# Patient Record
Sex: Female | Born: 2005
Health system: Southern US, Community
[De-identification: ages and names within clinical notes are randomized; demographics above are authoritative.]

## PROBLEM LIST (undated history)

## (undated) HISTORY — PX: TYMPANOSTOMY TUBE PLACEMENT: SHX32

---
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
## 2023-01-08 DIAGNOSIS — Z419 Encounter for procedure for purposes other than remedying health state, unspecified: Secondary | ICD-10-CM | POA: Diagnosis not present

## 2023-02-08 DIAGNOSIS — Z419 Encounter for procedure for purposes other than remedying health state, unspecified: Secondary | ICD-10-CM | POA: Diagnosis not present

## 2023-02-27 ENCOUNTER — Ambulatory Visit (INDEPENDENT_AMBULATORY_CARE_PROVIDER_SITE_OTHER): Payer: BC Managed Care – PPO | Admitting: Family Medicine

## 2023-02-27 ENCOUNTER — Encounter: Payer: Self-pay | Admitting: Family Medicine

## 2023-02-27 VITALS — BP 102/65 | HR 90 | Temp 98.4°F | Wt 258.0 lb

## 2023-02-27 DIAGNOSIS — H6991 Unspecified Eustachian tube disorder, right ear: Secondary | ICD-10-CM

## 2023-02-27 DIAGNOSIS — H6501 Acute serous otitis media, right ear: Secondary | ICD-10-CM | POA: Diagnosis not present

## 2023-02-27 MED ORDER — AMOXICILLIN-POT CLAVULANATE 875-125 MG PO TABS: 1.0000 | ORAL_TABLET | Freq: Two times a day (BID) | ORAL | 0 refills | Status: DC

## 2023-02-27 NOTE — Progress Notes (Signed)
Subjective: CC: Ear pain PCP: Gabriel Earing, FNP Abigail Barrett is a 17 y.o. female presenting to clinic today for:  1.  Ear pain Patient reports right-sided ear pain that has been ongoing for about 3 days now.  She reports some clear fluid that came out of it but denies any purulence, bleeding.  She does feel little dizzy at times but denies any falls.  Hearing has been a little duller as well.  She reports no nausea, vomiting, rhinorrhea, congestion, cough.  She has utilize heat, cold but those seem to make things worse.  She took some Tylenol after she had a fever to 101.8 F.   ROS: Per HPI  Allergies  Allergen Reactions   Red Dye #40 (Allura Red) Hives   History reviewed. No pertinent past medical history.  Current Outpatient Medications:    amoxicillin-clavulanate (AUGMENTIN) 875-125 MG tablet, Take 1 tablet by mouth 2 (two) times daily., Disp: 20 tablet, Rfl: 0   busPIRone (BUSPAR) 5 MG tablet, Take 1 tablet (5 mg total) by mouth 2 (two) times daily., Disp: 60 tablet, Rfl: 3   medroxyPROGESTERone Acetate 150 MG/ML SUSY, INJECT INTO THE MUSCLE EVERY 3 MONTHS, Disp: 1 mL, Rfl: 1 Social History   Socioeconomic History   Marital status: Single    Spouse name: Not on file   Number of children: Not on file   Years of education: Not on file   Highest education level: Not on file  Occupational History   Not on file  Tobacco Use   Smoking status: Never    Passive exposure: Yes   Smokeless tobacco: Never  Vaping Use   Vaping status: Never Used  Substance and Sexual Activity   Alcohol use: No   Drug use: No   Sexual activity: Never  Other Topics Concern   Not on file  Social History Narrative   Abigail Barrett is in the 11th grade at [employer]; she does well in school. She live with parents in separate households.    Social Determinants of Health   Financial Resource Strain: Not on file  Food Insecurity: Not on file  Transportation Needs: Not on file  Physical  Activity: Not on file  Stress: Not on file  Social Connections: Unknown (11/21/2021)   Received from [facility]   Social Network    Social Network: Not on file  Intimate Partner Violence: Unknown (10/11/2021)   Received from [facility], [facility]   HITS    Physically Hurt: Not on file    Insult or Talk Down To: Not on file    Threaten Physical Harm: Not on file    Scream or Curse: Not on file   Family History  Problem Relation Age of Onset   Depression Mother    Migraines Mother    Diabetes Father    Hyperlipidemia Father    Hypertension Father    Anxiety disorder Father    Seizures Neg Hx    Bipolar disorder Neg Hx    Schizophrenia Neg Hx    ADD / ADHD Neg Hx    Autism Neg Hx     Objective: Office vital signs reviewed. BP 102/65   Pulse 90   Temp 98.4 F (36.9 C)   Wt 258 lb (117 kg)   SpO2 97%   BMI 45.70 kg/m   Physical Examination:  General: Awake, alert, well nourished, No acute distress HEENT: TMs intact bilaterally.  Right TM bulging with erythema at the base.  Mild milky appearance to the fluid behind the TM but not overt purulence yet.  No lymphadenopathy appreciated. Cardio: regular rate and rhythm  Pulm: normal work of breathing on room air   Assessment/ Plan: 17 y.o. female   Non-recurrent acute serous otitis media of right ear - Plan: amoxicillin-clavulanate (AUGMENTIN) 875-125 MG tablet  Eustachian tube dysfunction, right  Augmentin sent.  Home care instructions were reinforced.  Reasons for reevaluation discussed.  Follow-up as needed   Raliegh Ip, DO [facility] [phone]

## 2023-02-27 NOTE — Patient Instructions (Signed)
Eustachian Tube Dysfunction  Eustachian tube dysfunction refers to a condition in which a blockage develops in the narrow passage that connects the middle ear to the back of the nose (eustachian tube). The eustachian tube regulates air pressure in the middle ear by letting air move between the ear and nose. It also helps to drain fluid from the middle ear space. Eustachian tube dysfunction can affect one or both ears. When the eustachian tube does not function properly, air pressure, fluid, or both can build up in the middle ear. What are the causes? This condition occurs when the eustachian tube becomes blocked or cannot open normally. Common causes of this condition include: Ear infections. Colds and other infections that affect the nose, mouth, and throat (upper respiratory tract). Allergies. Irritation from cigarette smoke. Irritation from stomach acid coming up into the esophagus (gastroesophageal reflux). The esophagus is the part of the body that moves food from the mouth to the stomach. Sudden changes in air pressure, such as from descending in an airplane or scuba diving. Abnormal growths in the nose or throat, such as: Growths that line the nose (nasal polyps). Abnormal growth of cells (tumors). Enlarged tissue at the back of the throat (adenoids). What increases the risk? You are more likely to develop this condition if: You smoke. You are overweight. You are a child who has: Certain birth defects of the mouth, such as cleft palate. Large tonsils or adenoids. What are the signs or symptoms? Common symptoms of this condition include: A feeling of fullness in the ear. Ear pain. Clicking or popping noises in the ear. Ringing in the ear (tinnitus). Hearing loss. Loss of balance. Dizziness. Symptoms may get worse when the air pressure around you changes, such as when you travel to an area of high elevation, fly on an airplane, or go scuba diving. How is this diagnosed? This  condition may be diagnosed based on: Your symptoms. A physical exam of your ears, nose, and throat. Tests, such as those that measure: The movement of your eardrum. Your hearing (audiometry). How is this treated? Treatment depends on the cause and severity of your condition. In mild cases, you may relieve your symptoms by moving air into your ears. This is called "popping the ears." In more severe cases, or if you have symptoms of fluid in your ears, treatment may include: Medicines to relieve congestion (decongestants). Medicines that treat allergies (antihistamines). Nasal sprays or ear drops that contain medicines that reduce swelling (steroids). A procedure to drain the fluid in your eardrum. In this procedure, a small tube may be placed in the eardrum to: Drain the fluid. Restore the air in the middle ear space. A procedure to insert a balloon device through the nose to inflate the opening of the eustachian tube (balloon dilation). Follow these instructions at home: Lifestyle Do not do any of the following until your health care provider approves: Travel to high altitudes. Fly in airplanes. Work in a pressurized cabin or room. Scuba dive. Do not use any products that contain nicotine or tobacco. These products include cigarettes, chewing tobacco, and vaping devices, such as e-cigarettes. If you need help quitting, ask your health care provider. Keep your ears dry. Wear fitted earplugs during showering and bathing. Dry your ears completely after. General instructions Take over-the-counter and prescription medicines only as told by your health care provider. Use techniques to help pop your ears as recommended by your health care provider. These may include: Chewing gum. Yawning. Frequent, forceful swallowing.   Closing your mouth, holding your nose closed, and gently blowing as if you are trying to blow air out of your nose. Keep all follow-up visits. This is important. Contact a  health care provider if: Your symptoms do not go away after treatment. Your symptoms come back after treatment. You are unable to pop your ears. You have: A fever. Pain in your ear. Pain in your head or neck. Fluid draining from your ear. Your hearing suddenly changes. You become very dizzy. You lose your balance. Get help right away if: You have a sudden, severe increase in any of your symptoms. Summary Eustachian tube dysfunction refers to a condition in which a blockage develops in the eustachian tube. It can be caused by ear infections, allergies, inhaled irritants, or abnormal growths in the nose or throat. Symptoms may include ear pain or fullness, hearing loss, or ringing in the ears. Mild cases are treated with techniques to unblock the ears, such as yawning or chewing gum. More severe cases are treated with medicines or procedures. This information is not intended to replace advice given to you by your health care provider. Make sure you discuss any questions you have with your health care provider. Document Revised: 09/06/2020 Document Reviewed: 09/06/2020 Elsevier Patient Education  2024 Elsevier Inc.  

## 2023-03-11 DIAGNOSIS — Z419 Encounter for procedure for purposes other than remedying health state, unspecified: Secondary | ICD-10-CM | POA: Diagnosis not present

## 2023-03-15 ENCOUNTER — Ambulatory Visit (INDEPENDENT_AMBULATORY_CARE_PROVIDER_SITE_OTHER): Payer: BC Managed Care – PPO

## 2023-03-15 DIAGNOSIS — Z3042 Encounter for surveillance of injectable contraceptive: Secondary | ICD-10-CM

## 2023-03-15 MED ORDER — MEDROXYPROGESTERONE ACETATE 150 MG/ML IM SUSP
150.0000 mg | INTRAMUSCULAR | Status: DC
  Administered 2023-03-15 – 2023-05-31 (×2): 150 mg via INTRAMUSCULAR

## 2023-03-15 NOTE — Progress Notes (Signed)
Medroxyprogesterone injection given to right upper outer quadrant.  Patient tolerated well. 

## 2023-04-10 DIAGNOSIS — Z419 Encounter for procedure for purposes other than remedying health state, unspecified: Secondary | ICD-10-CM | POA: Diagnosis not present

## 2023-05-03 ENCOUNTER — Ambulatory Visit: Payer: Medicaid Other | Admitting: Family Medicine

## 2023-05-09 IMAGING — US US PELVIS COMPLETE TRANSABD/TRANSVAG W DUPLEX
1 series · 13 of 25 positions shown · non-contrast
Comparison: None.

CLINICAL DATA: Right lower quadrant pain for 4 days, rule out
torsion

EXAM:
TRANSABDOMINAL AND TRANSVAGINAL ULTRASOUND OF PELVIS
DOPPLER ULTRASOUND OF OVARIES
TECHNIQUE: Both transabdominal and transvaginal ultrasound examinations of the
pelvis were performed. Transabdominal technique was performed for
global imaging of the pelvis including uterus, ovaries, adnexal
regions, and pelvic cul-de-sac.
It was necessary to proceed with endovaginal exam following the
transabdominal exam to visualize the uterus, endometrium, ovaries,
and adnexa. Color and duplex Doppler ultrasound was utilized to
evaluate blood flow to the ovaries.

[Series 1: us pelvic complete w transvaginal and torsion righ · 13 of 103 slices shown]
[im 1/103]
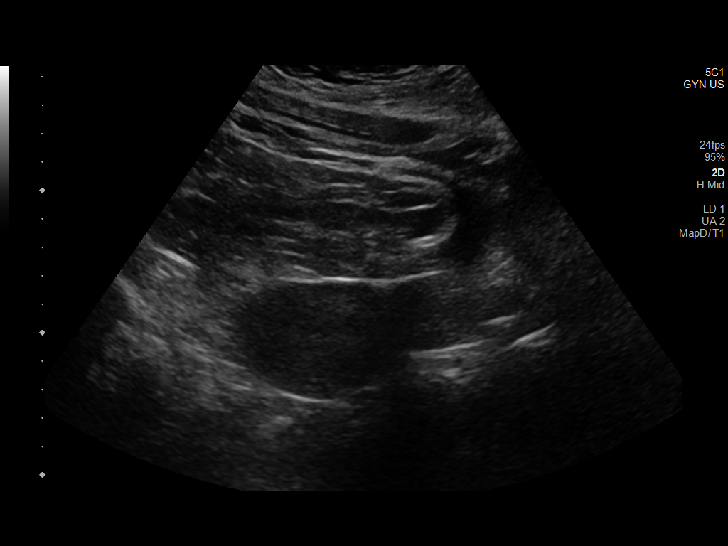
[im 9/103]
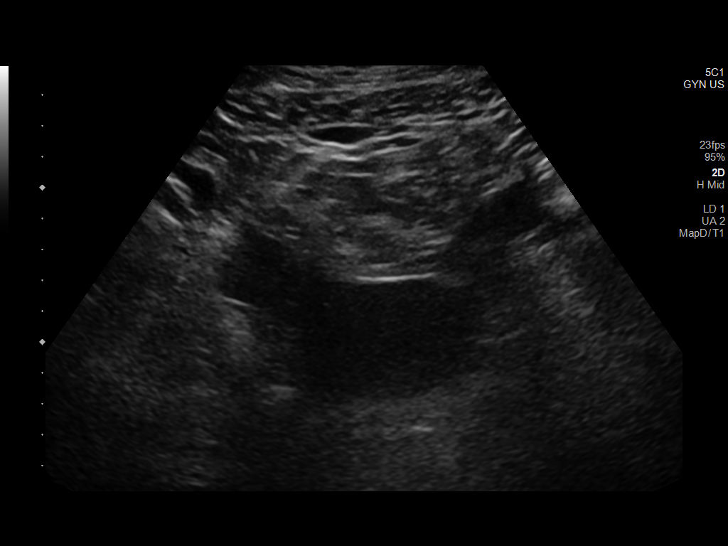
[im 18/103]
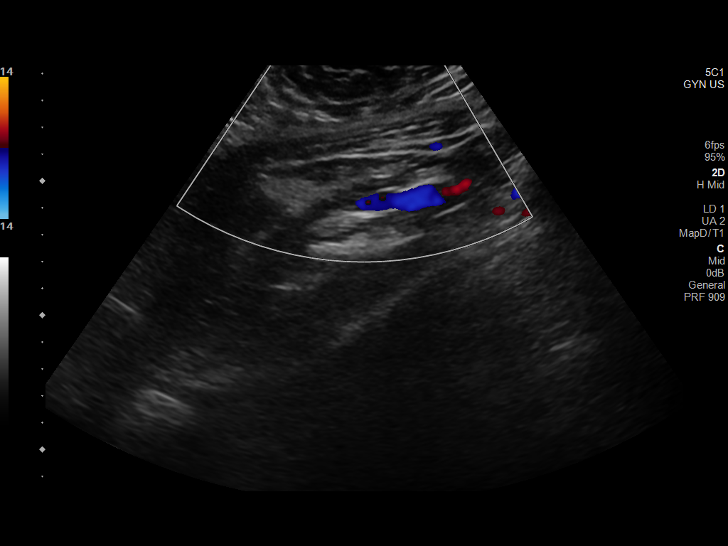
[im 26/103]
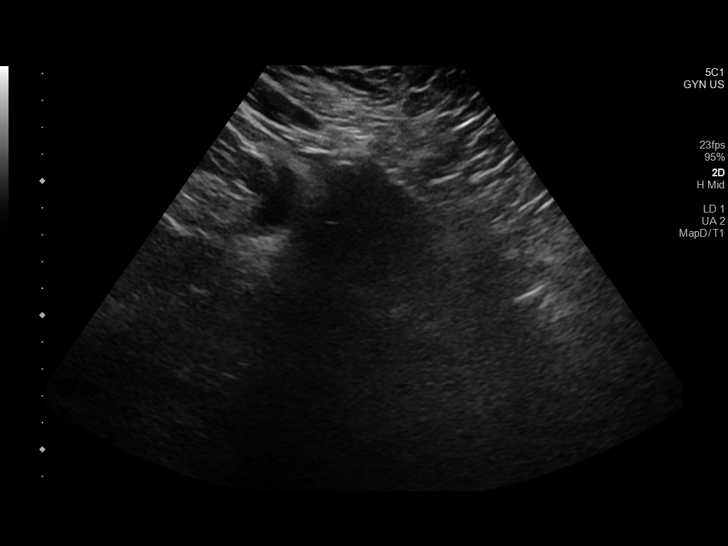
[im 35/103]
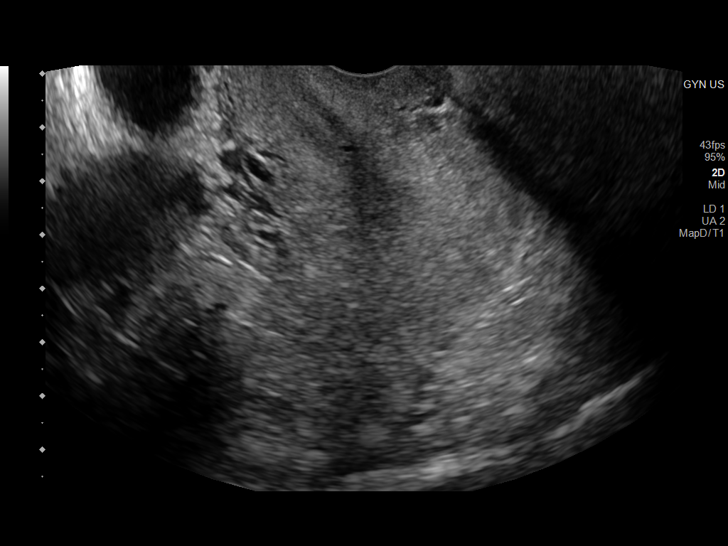
[im 43/103]
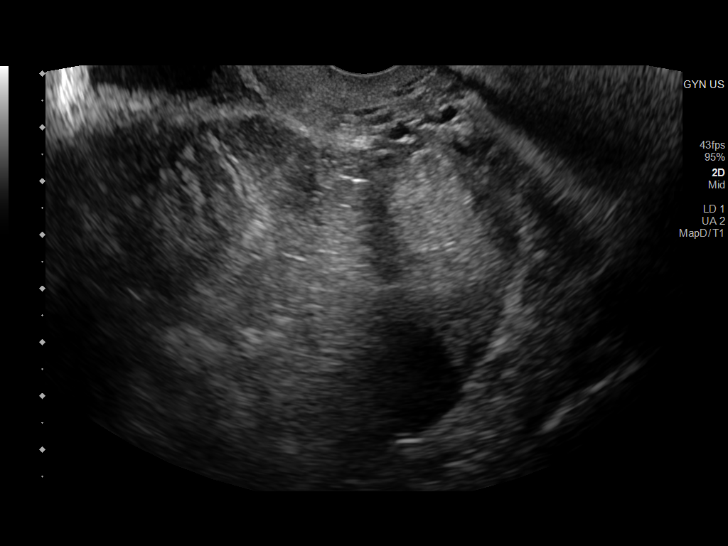
[im 52/103]
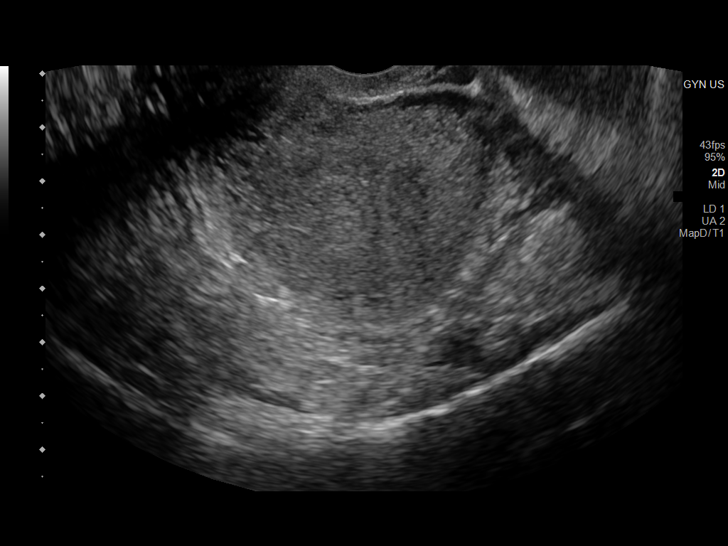
[im 60/103]
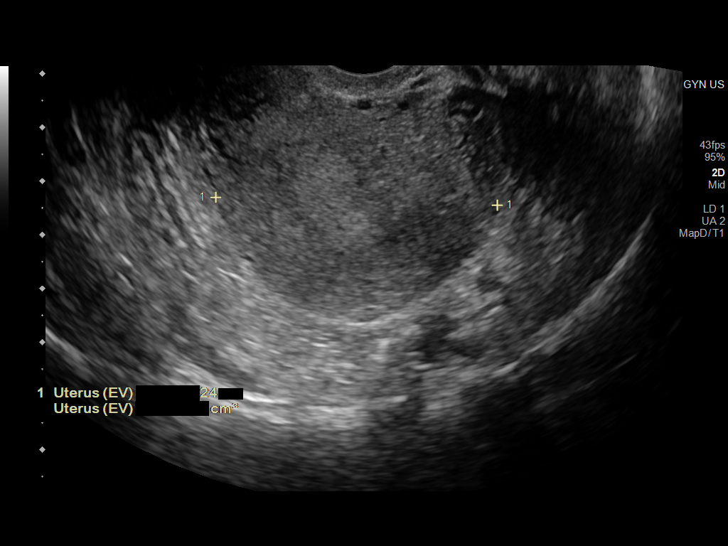
[im 69/103]
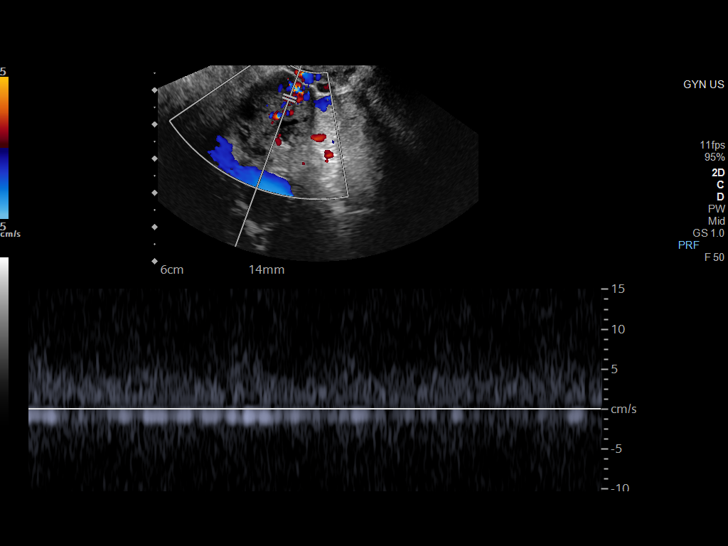
[im 77/103]
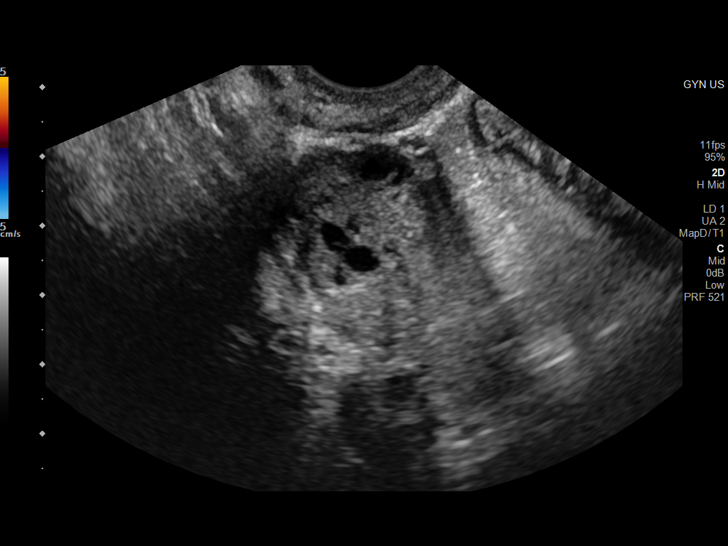
[im 86/103]
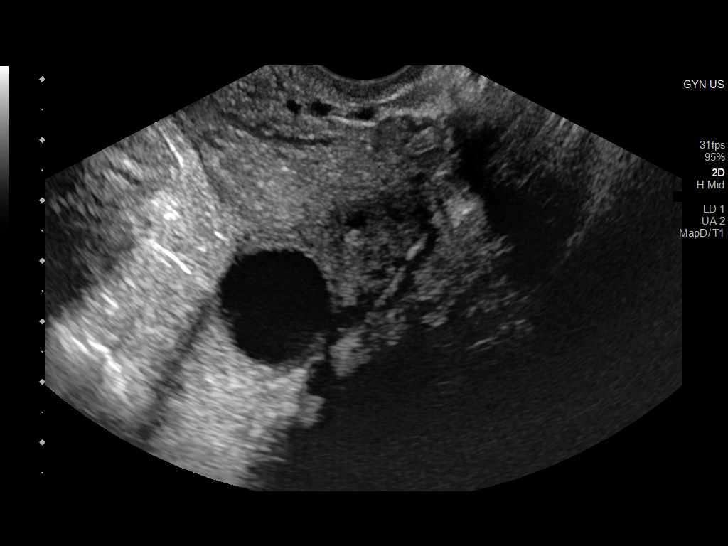
[im 94/103]
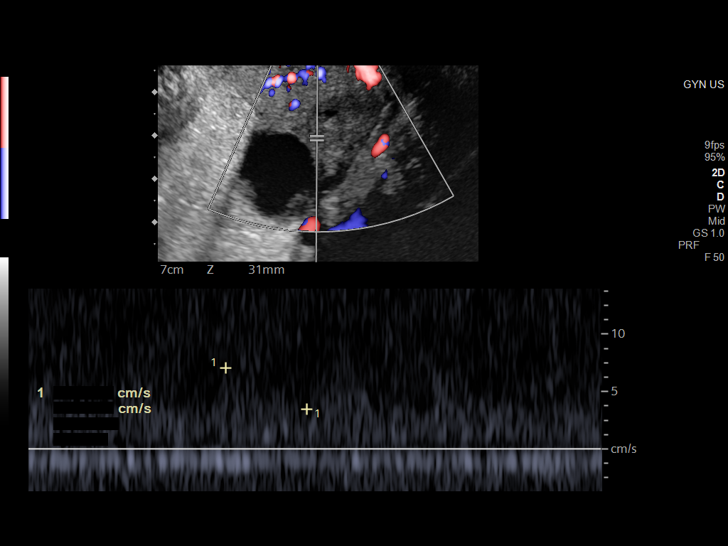
[im 103/103]
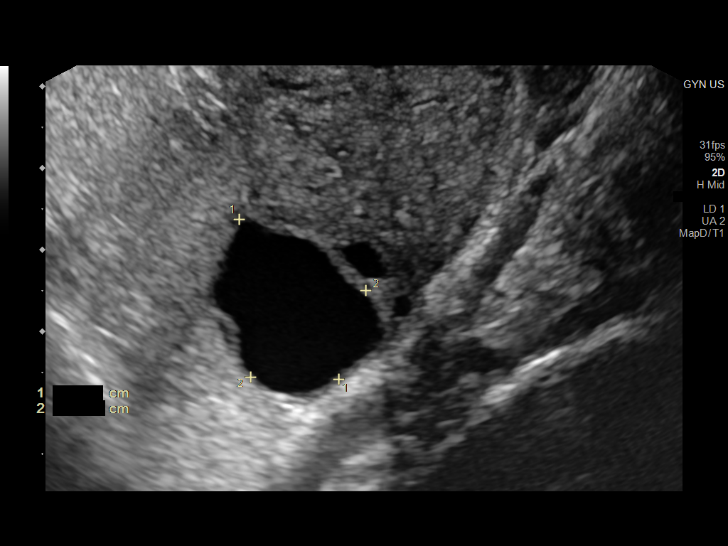

[13 of 25 positions shown; findings below may reference images not displayed]

FINDINGS: Uterus

Measurements: 7.7 x 4.4 x 5.2 cm = volume: 93 mL. No fibroids or
other mass visualized.

Endometrium

Thickness: 6 mm.  No focal abnormality visualized.

Right ovary

Measurements: 3.1 x 2.3 x 2.6 cm = volume: 10 mL. Normal
appearance/no adnexal mass.

Left ovary

Measurements: 4.2 x 2.4 x 2.5 cm = volume: 13 mL. Normal
appearance/no adnexal mass. Simple cyst measuring 1.9 cm.

Pulsed Doppler evaluation of both ovaries demonstrates normal
low-resistance arterial and venous waveforms.

Other findings

No abnormal free fluid.
IMPRESSION: 1.  No ultrasound findings of the pelvis to explain pain.

2. Normal size and appearance of the ovaries. Symmetric arterial and
venous Doppler flow is present bilaterally.

## 2023-05-11 DIAGNOSIS — Z419 Encounter for procedure for purposes other than remedying health state, unspecified: Secondary | ICD-10-CM | POA: Diagnosis not present

## 2023-05-24 ENCOUNTER — Telehealth: Payer: Self-pay | Admitting: Family Medicine

## 2023-05-24 ENCOUNTER — Other Ambulatory Visit (HOSPITAL_COMMUNITY): Payer: Self-pay

## 2023-05-24 NOTE — Telephone Encounter (Signed)
Pharmacy Patient Advocate Encounter     Per test claim: The current 90 day co-pay is, $0.  No PA needed at this time. This test claim was processed through [facility]- copay amounts may vary at other pharmacies due to pharmacy/plan contracts, or as the patient moves through the different stages of their insurance plan.

## 2023-05-24 NOTE — Telephone Encounter (Signed)
Copied from CRM [id number]. Topic: Clinical - Medical Advice >> May 24, 2023 10:42 AM Raven B wrote: Reason for CRM: PT contacted pharmacy for refill of  medroxyPROGESTERone (DEPO-PROVERA) injection 150 mg was told needs prior authorization. PT call back # [phone]

## 2023-05-24 NOTE — Telephone Encounter (Signed)
Copied from CRM [id number]. Topic: Clinical - Medication Refill >> May 24, 2023 10:38 AM Raven B wrote: Most Recent Primary Care Visit:  Provider: Quay Burow  Department: [facility]  Visit Type: INJECTION  Date: 03/15/2023  Medication: ***  Has the patient contacted their pharmacy?  (Agent: If no, request that the patient contact the pharmacy for the refill. If patient does not wish to contact the pharmacy document the reason why and proceed with request.) (Agent: If yes, when and what did the pharmacy advise?)  Is this the correct pharmacy for this prescription?  If no, delete pharmacy and type the correct one.  This is the patient's preferred pharmacy:  [facility] [address] [place] - [address] [place] [address] [place] [address] Phone: [phone] Fax: [phone]   Has the prescription been filled recently?   Is the patient out of the medication?   Has the patient been seen for an appointment in the last year OR does the patient have an upcoming appointment?   Can we respond through MyChart?   Agent: Please be advised that Rx refills may take up to 3 business days. We ask that you follow-up with your pharmacy.

## 2023-05-24 NOTE — Telephone Encounter (Signed)
Pt needed appt with triage nurse for depo so scheduled her with Triage 05/31/23.

## 2023-05-29 ENCOUNTER — Other Ambulatory Visit: Payer: Self-pay | Admitting: Family Medicine

## 2023-05-29 ENCOUNTER — Telehealth: Payer: Self-pay

## 2023-05-29 DIAGNOSIS — Z3042 Encounter for surveillance of injectable contraceptive: Secondary | ICD-10-CM

## 2023-05-29 NOTE — Telephone Encounter (Signed)
Copied from CRM [id number]. Topic: Clinical - Medication Refill >> May 29, 2023  3:20 PM Larwance Sachs wrote: Reason for CRM: Patient stated recently checking on prescription for medroxyPROGESTERone (DEPO-PROVERA) injection 150 mg and was told it was being processed. Patient outreach to Pharmacy and was told prescription was expired. Patient is wondering can it be called In or can she be called for update before upcoming appointment   Call back at [phone]

## 2023-05-30 MED ORDER — MEDROXYPROGESTERONE ACETATE 150 MG/ML IM SUSY: PREFILLED_SYRINGE | INTRAMUSCULAR | 0 refills | Status: DC

## 2023-05-30 NOTE — Addendum Note (Signed)
Addended by: Hessie Diener on: 05/30/2023 07:45 AM   Modules accepted: Orders

## 2023-05-30 NOTE — Telephone Encounter (Signed)
I spoke to pt and advised 1 refill would be sent in and that she would need appt for CPE prior to any further refills.

## 2023-05-31 ENCOUNTER — Ambulatory Visit (INDEPENDENT_AMBULATORY_CARE_PROVIDER_SITE_OTHER): Payer: Medicaid Other

## 2023-05-31 DIAGNOSIS — Z3042 Encounter for surveillance of injectable contraceptive: Secondary | ICD-10-CM

## 2023-05-31 NOTE — Progress Notes (Signed)
Medroxyprogesterone injection given to left upper outer quadrant.  Patient tolerated well. 

## 2023-06-01 ENCOUNTER — Ambulatory Visit: Payer: Medicaid Other | Admitting: Family Medicine

## 2023-06-10 DIAGNOSIS — Z419 Encounter for procedure for purposes other than remedying health state, unspecified: Secondary | ICD-10-CM | POA: Diagnosis not present

## 2023-07-11 DIAGNOSIS — Z419 Encounter for procedure for purposes other than remedying health state, unspecified: Secondary | ICD-10-CM | POA: Diagnosis not present

## 2023-08-11 DIAGNOSIS — Z419 Encounter for procedure for purposes other than remedying health state, unspecified: Secondary | ICD-10-CM | POA: Diagnosis not present

## 2023-08-22 ENCOUNTER — Other Ambulatory Visit: Payer: Self-pay | Admitting: Family Medicine

## 2023-08-22 DIAGNOSIS — Z3042 Encounter for surveillance of injectable contraceptive: Secondary | ICD-10-CM

## 2023-08-23 ENCOUNTER — Encounter: Payer: Medicaid Other | Admitting: Family Medicine

## 2023-08-23 MED ORDER — MEDROXYPROGESTERONE ACETATE 150 MG/ML IM SUSY: PREFILLED_SYRINGE | INTRAMUSCULAR | 0 refills | Status: DC

## 2023-08-24 ENCOUNTER — Ambulatory Visit: Payer: Medicaid Other | Admitting: Family Medicine

## 2023-08-24 ENCOUNTER — Encounter: Payer: Self-pay | Admitting: Family Medicine

## 2023-08-24 VITALS — BP 109/74 | HR 107 | Temp 98.5°F | Ht 63.0 in | Wt 269.0 lb

## 2023-08-24 DIAGNOSIS — F321 Major depressive disorder, single episode, moderate: Secondary | ICD-10-CM | POA: Diagnosis not present

## 2023-08-24 DIAGNOSIS — Z0000 Encounter for general adult medical examination without abnormal findings: Secondary | ICD-10-CM | POA: Diagnosis not present

## 2023-08-24 DIAGNOSIS — Z6841 Body Mass Index (BMI) 40.0 and over, adult: Secondary | ICD-10-CM | POA: Diagnosis not present

## 2023-08-24 DIAGNOSIS — Z3042 Encounter for surveillance of injectable contraceptive: Secondary | ICD-10-CM

## 2023-08-24 MED ORDER — MEDROXYPROGESTERONE ACETATE 150 MG/ML IM SUSY: PREFILLED_SYRINGE | INTRAMUSCULAR | 3 refills | Status: AC

## 2023-08-24 MED ORDER — MEDROXYPROGESTERONE ACETATE 150 MG/ML IM SUSY
150.0000 mg | PREFILLED_SYRINGE | Freq: Once | INTRAMUSCULAR | Status: AC
  Administered 2023-08-24: 150 mg via INTRAMUSCULAR

## 2023-08-24 MED ORDER — TESTOSTERONE CYPIONATE 200 MG/ML IM SOLN: 150.0000 mg | Freq: Once | INTRAMUSCULAR | Status: DC

## 2023-08-24 NOTE — Progress Notes (Signed)
Complete physical exam  Patient: Abigail Barrett   DOB: 23-May-2006   17 y.o. Female  MRN: [id number]  Subjective:    Chief Complaint  Patient presents with   Annual Exam    Depo provera injection     Abigail Barrett is a 19 y.o. female who presents today for a complete physical exam. She reports consuming a general diet. Gym/ health club routine includes running 2-3 miles daily. She generally feels well. She reports sleeping poorly overall due to work schedule. She does not have additional problems to discuss today.    Most recent fall risk assessment:    02/27/2023    9:03 AM  Fall Risk   Falls in the past year? 0  Number falls in past yr: 0  Injury with Fall? 0  Risk for fall due to : No Fall Risks  Follow up Education provided     Most recent depression screenings:    08/24/2023    1:10 PM 02/27/2023    9:02 AM 07/21/2022   10:08 AM  Depression screen PHQ 2/9  Decreased Interest 1 0 0  Down, Depressed, Hopeless 0 0 0  PHQ - 2 Score 1 0 0  Altered sleeping 2 2 0  Tired, decreased energy 3 2 0  Change in appetite 0 0 0  Feeling bad or failure about yourself  0 0 0  Trouble concentrating 0 2 0  Moving slowly or fidgety/restless 0 0 0  Suicidal thoughts 0 0 0  PHQ-9 Score 6 6 0  Difficult doing work/chores Somewhat difficult Somewhat difficult Not difficult at all      08/24/2023    1:10 PM 02/27/2023    9:02 AM 07/21/2022   10:08 AM 03/03/2022   10:34 AM  GAD 7 : Generalized Anxiety Score  Nervous, Anxious, on Edge 0 2 2 0  Control/stop worrying 0 2 2 2   Worry too much - different things 0 0 2 2  Trouble relaxing 3 3 3 2   Restless 2 0 3 2  Easily annoyed or irritable 3 3 1 1   Afraid - awful might happen 0 0 0 0  Total GAD 7 Score 8 10 13 9   Anxiety Difficulty Somewhat difficult Somewhat difficult Somewhat difficult Somewhat difficult      Vision:Not within last year  and Dental: No current dental problems and Receives regular dental care  History reviewed.  No pertinent past medical history.    Patient Care Team: Gabriel Earing, FNP as PCP - General (Family Medicine)   Outpatient Medications Prior to Visit  Medication Sig   medroxyPROGESTERone Acetate 150 MG/ML SUSY INJECT INTO THE MUSCLE EVERY 3 MONTHS   [DISCONTINUED] amoxicillin-clavulanate (AUGMENTIN) 875-125 MG tablet Take 1 tablet by mouth 2 (two) times daily.   [DISCONTINUED] busPIRone (BUSPAR) 5 MG tablet Take 1 tablet (5 mg total) by mouth 2 (two) times daily.   No facility-administered medications prior to visit.    ROS Negative unless specially indicated above in HPI.       Objective:     BP 109/74   Pulse (!) 107   Temp 98.5 F (36.9 C)   Ht 5\' 3"  (1.6 m)   Wt 269 lb (122 kg)   SpO2 96%   BMI 47.65 kg/m    Physical Exam Vitals and nursing note reviewed.  Constitutional:      General: She is not in acute distress.    Appearance: She is obese. She is not ill-appearing, toxic-appearing or  diaphoretic.  HENT:     Head: Normocephalic.     Right Ear: Tympanic membrane, ear canal and external ear normal.     Left Ear: Tympanic membrane, ear canal and external ear normal.     Nose: Nose normal.     Mouth/Throat:     Mouth: Mucous membranes are moist.     Pharynx: Oropharynx is clear.  Eyes:     Extraocular Movements: Extraocular movements intact.     Conjunctiva/sclera: Conjunctivae normal.     Pupils: Pupils are equal, round, and reactive to light.  Neck:     Thyroid: No thyroid mass, thyromegaly or thyroid tenderness.  Cardiovascular:     Rate and Rhythm: Normal rate and regular rhythm.     Pulses: Normal pulses.     Heart sounds: Normal heart sounds. No murmur heard.    No friction rub. No gallop.  Pulmonary:     Effort: Pulmonary effort is normal.     Breath sounds: Normal breath sounds.  Abdominal:     General: Bowel sounds are normal. There is no distension.     Palpations: Abdomen is soft. There is no mass.     Tenderness: There is no  abdominal tenderness. There is no guarding.  Musculoskeletal:        General: No swelling or tenderness. Normal range of motion.     Cervical back: Normal range of motion and neck supple.  Skin:    General: Skin is warm and dry.     Capillary Refill: Capillary refill takes less than 2 seconds.     Findings: No lesion or rash.  Neurological:     General: No focal deficit present.     Mental Status: She is alert and oriented to person, place, and time.     Cranial Nerves: No cranial nerve deficit.     Motor: No weakness.     Coordination: Coordination normal.     Gait: Gait normal.  Psychiatric:        Mood and Affect: Mood normal.        Behavior: Behavior normal.        Thought Content: Thought content normal.        Judgment: Judgment normal.      No results found for any visits on 08/24/23.     Assessment & Plan:    Routine Health Maintenance and Physical Exam  Abigail Barrett was seen today for annual exam.  Diagnoses and all orders for this visit:  Routine general medical examination at a health care facility  Encounter for surveillance of injectable contraceptive Depo Provera IM injection today in office. Patient is within the appropriate window.  -     medroxyPROGESTERone Acetate 150 MG/ML SUSY; INJECT INTO THE MUSCLE EVERY 3 MONTHS  Obesity, morbid (HCC) Declines labs today. Diet, exercise, weight loss.   Depression, major, single episode, moderate (HCC) Declines treatment. Stable. Denies SI.    Immunization History  Administered Date(s) Administered   DTaP 03/17/2004, 05/30/2004, 08/17/2004, 05/11/2005, 01/02/2008   DTaP / Hep B / IPV 03/17/2004, 05/30/2004, 08/17/2004   Dtap, Unspecified 05/11/2005, 01/02/2008   HIB (PRP-OMP) 03/17/2004, 05/30/2004, 08/17/2004   HIB, Unspecified 03/17/2004, 05/30/2004, 08/17/2004   Hep A, Unspecified 01/02/2006, 07/05/2006   Hep B, Unspecified Aug 11, 2003   Hepatitis A 01/02/2006, 07/05/2006   Hepatitis A, Adult 01/02/2006,  07/05/2006   Hepatitis B 02-11-04, 03/17/2004, 05/30/2004, 08/17/2004   Hepatitis B, PED/ADOLESCENT 03/17/2004, 05/30/2004, 08/17/2004   IPV 03/17/2004, 05/30/2004, 08/17/2004, 01/02/2008  Influenza Nasal 05/31/2009   Influenza Split 07/19/2011   Influenza,inj,Quad PF,6+ Mos 04/17/2017, 08/12/2020   MMR 01/12/2005, 01/02/2008   Meningococcal Conjugate 01/29/2015   Meningococcal Mcv4o 10/29/2020   Pneumococcal Conjugate-13 03/17/2004, 05/30/2004, 08/17/2004, 01/12/2005   Tdap 01/29/2015   Varicella 01/12/2005, 01/02/2008    Health Maintenance  Topic Date Due   COVID-19 Vaccine (1 - 2024-25 season) 09/09/2023 (Originally 03/11/2023)   INFLUENZA VACCINE  10/08/2023 (Originally 02/08/2023)   HPV VACCINES (1 - 3-dose series) 08/23/2024 (Originally 12/31/2018)   Hepatitis C Screening  08/23/2024 (Originally 12/30/2021)   HIV Screening  08/23/2024 (Originally 12/31/2018)   DTaP/Tdap/Td (7 - Td or Tdap) 01/28/2025   Pneumococcal Vaccine 46-68 Years old  Completed    Discussed health benefits of physical activity, and encouraged her to engage in regular exercise appropriate for her age and condition.  Problem List Items Addressed This Visit       Other   Depression, major, single episode, moderate (HCC)   Obesity, morbid (HCC)   Other Visit Diagnoses       Routine general medical examination at a health care facility    -  Primary     Encounter for surveillance of injectable contraceptive       Relevant Medications   medroxyPROGESTERone Acetate 150 MG/ML SUSY      Return in 1 year (on 08/23/2024) for CPE with fasting labs. .   The patient indicates understanding of these issues and agrees with the plan.  Gabriel Earing, FNP

## 2023-08-24 NOTE — Patient Instructions (Signed)

## 2023-09-08 DIAGNOSIS — Z419 Encounter for procedure for purposes other than remedying health state, unspecified: Secondary | ICD-10-CM | POA: Diagnosis not present

## 2023-11-12 ENCOUNTER — Ambulatory Visit
